# Patient Record
Sex: Female | Born: 1940 | Race: White | Hispanic: No | Marital: Married
Health system: Midwestern US, Academic
[De-identification: ages and names within clinical notes are randomized; demographics above are authoritative.]

---

## 2016-12-19 MED ORDER — CLONAZEPAM 0.25 MG PO TBDI
ORAL_TABLET | 1 refills | Status: DC
Start: 2016-12-19 — End: 2017-02-15

## 2017-02-15 ENCOUNTER — Encounter: Admit: 2017-02-15 | Discharge: 2017-02-15 | Payer: MEDICARE

## 2017-02-15 MED ORDER — CLONAZEPAM 0.25 MG PO TBDI
ORAL_TABLET | 1 refills | Status: AC
Start: 2017-02-15 — End: 2017-05-03

## 2017-02-15 NOTE — Telephone Encounter
Fax received from pt's pharmacy requesting a refill for Clonazepam.  Per protocol refill sent to pt's pharmacy.

## 2017-03-14 ENCOUNTER — Encounter: Admit: 2017-03-14 | Discharge: 2017-03-14 | Payer: MEDICARE

## 2017-03-14 DIAGNOSIS — Z86000 Personal history of in-situ neoplasm of breast: ICD-10-CM

## 2017-03-14 DIAGNOSIS — Z1371 Encounter for nonprocreative screening for genetic disease carrier status: Principal | ICD-10-CM

## 2017-03-14 DIAGNOSIS — D0510 Intraductal carcinoma in situ of unspecified breast: ICD-10-CM

## 2017-03-14 DIAGNOSIS — K219 Gastro-esophageal reflux disease without esophagitis: ICD-10-CM

## 2017-03-14 DIAGNOSIS — G909 Disorder of the autonomic nervous system, unspecified: ICD-10-CM

## 2017-03-14 DIAGNOSIS — G479 Sleep disorder, unspecified: ICD-10-CM

## 2017-03-14 DIAGNOSIS — J45909 Unspecified asthma, uncomplicated: ICD-10-CM

## 2017-03-14 DIAGNOSIS — IMO0002 Genetic counseling: ICD-10-CM

## 2017-03-14 DIAGNOSIS — Z08 Encounter for follow-up examination after completed treatment for malignant neoplasm: Principal | ICD-10-CM

## 2017-03-14 DIAGNOSIS — H269 Unspecified cataract: ICD-10-CM

## 2017-03-14 DIAGNOSIS — E7800 Pure hypercholesterolemia, unspecified: ICD-10-CM

## 2017-03-14 DIAGNOSIS — D0511 Intraductal carcinoma in situ of right breast: Principal | ICD-10-CM

## 2017-03-14 DIAGNOSIS — Z9013 Acquired absence of bilateral breasts and nipples: ICD-10-CM

## 2017-03-14 DIAGNOSIS — I8290 Acute embolism and thrombosis of unspecified vein: ICD-10-CM

## 2017-03-14 DIAGNOSIS — K8020 Calculus of gallbladder without cholecystitis without obstruction: ICD-10-CM

## 2017-03-14 DIAGNOSIS — K3184 Gastroparesis: ICD-10-CM

## 2017-03-14 DIAGNOSIS — R259 Unspecified abnormal involuntary movements: ICD-10-CM

## 2017-03-14 DIAGNOSIS — E079 Disorder of thyroid, unspecified: ICD-10-CM

## 2017-03-14 LAB — CBC AND DIFF
Lab: 0 10*3/uL (ref 0–0.20)
Lab: 4.6 10*3/uL (ref 4.5–11.0)
Lab: 4.7 M/UL (ref 4.0–5.0)

## 2017-03-14 NOTE — Progress Notes
Name: Tara Phillips          MRN: [id number]      DOB: September 17, 1940      AGE: 76 y.o.   DATE OF SERVICE: 03/14/2017    Subjective:             Reason for Visit:  Heme/Onc Care      Tara Phillips is a 76 y.o. female.     Cancer Staging  No matching staging information was found for the patient.    History of Present Illness    Patient is transfer of care from Dr. Grayce Sessions.  EMR and paper chart is reviewed with today's visit.    Tara Phillips is a 76 year old Caucasian female.  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  Status post bilateral mastectomies.  She had a reconstruction.  She did not require hormonal therapy.    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  She had pain over her left breast implant.  She feels that her body is reacting to the implant by way of the lipomas.  She finally had both implants removed in January 2018 and is doing well.    Past Medical History:   Diagnosis Date   ??? Abnormal involuntary movement    ??? Asthma    ??? Blood clot in vein    ??? BRCA negative    ??? Cataract    ??? DCIS (ductal carcinoma in situ)     right breast   ??? Esophageal reflux    ??? Gallstones    ??? Gastroparesis 09/2008   ??? Genetic counseling    ??? Pure hypercholesterolemia    ??? Sleep disorder    ??? Thyroid disease    ??? Unspecified disorder of autonomic nervous system      Past Surgical History:   Procedure Laterality Date   ??? HX HYSTERECTOMY  1984   ??? HX CHOLECYSTECTOMY  2002   ??? UPPER GASTROINTESTINAL ENDOSCOPY  12/2006   ??? COLONOSCOPY  03/2007   ??? LYMPH NODE BIOPSY  06/08/2009    right and left axillary sentinel   ??? BREAST BIOPSY      right - 1983, left - 2001   ??? HX MASTECTOMY      bilateral and reconstruction   ??? OOPHORECTOMY     ??? WRIST SURGERY       Family History   Problem Relation Age of Onset   ??? Cancer Mother      myeloma & kidney cancer   ??? Arthritis-osteo Mother    ??? Cancer Sister 10     lymphoma   ??? Cancer-Thyroid Sister ??? Thyroid Disease Sister    ??? Cancer-Breast Maternal Aunt    ??? Cancer Maternal Aunt    ??? Cancer-Colon Maternal Uncle    ??? Cancer-Breast Maternal Grandmother    ??? Cancer Maternal Grandmother    ??? Cancer-Thyroid Sister 64   ??? Heart Disease Maternal Grandfather    ??? Cancer-Breast Maternal Aunt    ??? Cancer Maternal Aunt    ??? Cancer-Breast Maternal Aunt    ??? Heart Disease Maternal Uncle    ??? Cancer-Breast Other    ??? Cancer-Colon Father    ??? Cancer Father    ??? Arthritis-osteo Father      Social History     Social History   ??? Marital status: Married     Spouse name: N/A   ???  Number of children: N/A   ??? Years of education: N/A     Social History Main Topics   ??? Smoking status: Never Smoker   ??? Smokeless tobacco: Never Used   ??? Alcohol use No   ??? Drug use: No   ??? Sexual activity: Not on file     Other Topics Concern   ??? Not on file     Social History Narrative   ??? No narrative on file            Review of Systems   Gastrointestinal: Positive for constipation.   Psychiatric/Behavioral: Positive for sleep disturbance. The patient is nervous/anxious.    All other systems reviewed and are negative.        Objective:         ??? APIXABAN (ELIQUIS PO) Take 2.5 mg by mouth twice daily.   ??? Biotin 10,000 mcg cap Take  by mouth daily.   ??? bisacodyl (LAXATIVE (BISACODYL)) 10 mg rectal suppository Insert or Apply 10 mg to rectal area as directed daily.   ??? calcium carbonate/vitamin D-3 (OSCAL-500+D) 1250 mg/200 unit tablet Take 1 Tab by mouth twice daily with meals. Calcium Carb 1250mg  delivers 500mg  elemental Ca   ??? CALCIUM-VITAMIN D3 PO Take 500 mg by mouth daily.   ??? clonazePAM(+) (KLONOPIN) 0.25 mg rapid dissolve tablet 1-2 po q HS   ??? MAGNESIUM CITRATE PO Take 150 mg by mouth twice daily.   ??? melatonin 3 mg tab Take 13 mg by mouth at bedtime daily.     Vitals:    03/14/17 1439   BP: 101/53   Pulse: 61   Resp: 16   Temp: 36.6 ???C (97.9 ???F)   TempSrc: Oral   SpO2: 100%   Weight: 60.4 kg (133 lb 3.2 oz)   Height: 168.9 cm (66.5) Body mass index is 21.18 kg/m???.     Pain Score: Zero         Pain Addressed:  N/A    Patient Evaluated for a Clinical Trial: Patient not eligible for a treatment trial (including not needing treatment, needs palliative care, in remission).     [place] Cooperative Oncology Group performance status is 0, Fully active, able to carry on all pre-disease performance without restriction.Marland Kitchen     Physical Exam   Constitutional: She appears well-nourished. No distress.   HENT:   Mouth/Throat: Oropharynx is clear and moist.   Eyes: Conjunctivae are normal. No scleral icterus.   Neck: Neck supple.   Cardiovascular: Normal rate, regular rhythm and normal heart sounds.    Pulmonary/Chest: Effort normal and breath sounds normal. She exhibits no mass (bilateral mastectomy scars, no implant).   Abdominal: Soft. Bowel sounds are normal.   Musculoskeletal: She exhibits no edema or tenderness.   Lymphadenopathy:     She has no cervical adenopathy.     She has no axillary adenopathy.   Neurological: No cranial nerve deficit. Coordination normal.   Skin: Skin is warm.   Psychiatric: She has a normal mood and affect. Her behavior is normal. Thought content normal.     CBC w/Diff    Lab Results   Component Value Date/Time    WBC 4.6 03/14/2017 01:39 PM    RBC 4.77 03/14/2017 01:39 PM    HGB 12.9 03/14/2017 01:39 PM    HCT 39.0 03/14/2017 01:39 PM    MCV 81.8 03/14/2017 01:39 PM    MCH 27.0 03/14/2017 01:39 PM    MCHC 33.0 03/14/2017 01:39 PM  RDW 14.8 03/14/2017 01:39 PM    PLTCT 234 03/14/2017 01:39 PM    MPV 8.2 03/14/2017 01:39 PM    Lab Results   Component Value Date/Time    NEUT 47 03/14/2017 01:39 PM    ANC 2.20 03/14/2017 01:39 PM    LYMA 36 03/14/2017 01:39 PM    ALC 1.70 03/14/2017 01:39 PM    MONA 12 03/14/2017 01:39 PM    AMC 0.60 03/14/2017 01:39 PM    EOSA 4 03/14/2017 01:39 PM    AEC 0.20 03/14/2017 01:39 PM    BASA 1 03/14/2017 01:39 PM    ABC 0.00 03/14/2017 01:39 PM                  Assessment and Plan: 1.  Tara Phillips is 76 years old, who had a history of right breast ductal carcinoma in situ, ER/PR negative.  2010.  Status post bilateral mastectomies.  She has removed her implants.  She said that the lipomas has regressed ever since removing the implants.  There is no clinical evidence of recurrence.  We will continue seeing her on a yearly basis in the oncology clinic at least up to 10 years and then we can offer for her to return surveillance with her primary care physician.    Discussed with the patient and all questions fully answered. She will call me if any problems arise.  25 minutes spent face to face with patient, with more than 50% of time on counseling and coordination of care.

## 2017-03-15 ENCOUNTER — Encounter: Admit: 2017-03-15 | Discharge: 2017-03-15 | Payer: MEDICARE

## 2017-03-15 LAB — COMPREHENSIVE METABOLIC PANEL
Lab: 0.6 mg/dL (ref 0.3–1.2)
Lab: 0.7 mg/dL (ref 0.4–1.00)
Lab: 137 MMOL/L (ref 137–147)
Lab: 14 U/L (ref 7–56)
Lab: 14 mg/dL (ref 7–25)
Lab: 26 MMOL/L (ref 21–30)
Lab: 4.1 g/dL (ref 3.5–5.0)
Lab: 4.6 MMOL/L (ref 3.5–5.1)
Lab: 55 U/L (ref 25–110)
Lab: 6.9 g/dL (ref 6.0–8.0)
Lab: 7 K/UL (ref 3–12)
Lab: 84 mg/dL (ref 70–100)
Lab: 9.3 mg/dL (ref 8.5–10.6)
Lab: >60 mL/min (ref >60)
Lab: >60 mL/min (ref >60)

## 2017-03-15 LAB — LDH-LACTATE DEHYDROGENASE: Lab: 336 U/L — ABNORMAL HIGH (ref 100–210)

## 2017-03-15 NOTE — Telephone Encounter
-----   Message from Sammuel BailiffElizabeth Ng, MD sent at 03/15/2017  8:09 AM CDT -----  I have reviewed all lab results, which are normal or stable. Please inform the patient.

## 2017-03-15 NOTE — Telephone Encounter
Informed the patient that Dr. Olena LeatherwoodNg reviewed all of her lab results and the results are normal or stable. The patient had no questions.

## 2017-05-03 ENCOUNTER — Encounter: Admit: 2017-05-03 | Discharge: 2017-05-03 | Payer: MEDICARE

## 2017-05-03 MED ORDER — CLONAZEPAM 0.25 MG PO TBDI
ORAL_TABLET | 1 refills | Status: AC
Start: 2017-05-03 — End: 2017-10-30

## 2017-05-03 NOTE — Telephone Encounter
Refill request from pt's pharmacy for Clonazepam 0.25 mg. Per protocol refill approved and called into pt's pharmacy.

## 2017-06-21 ENCOUNTER — Ambulatory Visit: Admit: 2017-06-21 | Discharge: 2017-06-21 | Payer: MEDICARE

## 2017-06-21 ENCOUNTER — Encounter: Admit: 2017-06-21 | Discharge: 2017-06-21 | Payer: MEDICARE

## 2017-06-21 DIAGNOSIS — H269 Unspecified cataract: ICD-10-CM

## 2017-06-21 DIAGNOSIS — IMO0002 Genetic counseling: ICD-10-CM

## 2017-06-21 DIAGNOSIS — E7800 Pure hypercholesterolemia, unspecified: ICD-10-CM

## 2017-06-21 DIAGNOSIS — K8020 Calculus of gallbladder without cholecystitis without obstruction: ICD-10-CM

## 2017-06-21 DIAGNOSIS — K3184 Gastroparesis: ICD-10-CM

## 2017-06-21 DIAGNOSIS — I8290 Acute embolism and thrombosis of unspecified vein: ICD-10-CM

## 2017-06-21 DIAGNOSIS — K219 Gastro-esophageal reflux disease without esophagitis: ICD-10-CM

## 2017-06-21 DIAGNOSIS — G479 Sleep disorder, unspecified: ICD-10-CM

## 2017-06-21 DIAGNOSIS — G4752 REM sleep behavior disorder: Principal | ICD-10-CM

## 2017-06-21 DIAGNOSIS — Z1371 Encounter for nonprocreative screening for genetic disease carrier status: Principal | ICD-10-CM

## 2017-06-21 DIAGNOSIS — J45909 Unspecified asthma, uncomplicated: ICD-10-CM

## 2017-06-21 DIAGNOSIS — D0510 Intraductal carcinoma in situ of unspecified breast: ICD-10-CM

## 2017-06-21 DIAGNOSIS — E079 Disorder of thyroid, unspecified: ICD-10-CM

## 2017-06-21 DIAGNOSIS — R259 Unspecified abnormal involuntary movements: ICD-10-CM

## 2017-06-21 DIAGNOSIS — G909 Disorder of the autonomic nervous system, unspecified: ICD-10-CM

## 2017-06-21 MED ORDER — CLONAZEPAM 0.125 MG PO TBDI
0.125 mg | ORAL_TABLET | Freq: Every evening | ORAL | 1 refills | Status: AC
Start: 2017-06-21 — End: 2017-10-30

## 2017-10-30 ENCOUNTER — Ambulatory Visit: Admit: 2017-10-30 | Discharge: 2017-10-30 | Payer: MEDICARE

## 2017-10-30 ENCOUNTER — Encounter: Admit: 2017-10-30 | Discharge: 2017-10-30 | Payer: MEDICARE

## 2017-10-30 DIAGNOSIS — K219 Gastro-esophageal reflux disease without esophagitis: ICD-10-CM

## 2017-10-30 DIAGNOSIS — R259 Unspecified abnormal involuntary movements: ICD-10-CM

## 2017-10-30 DIAGNOSIS — G909 Disorder of the autonomic nervous system, unspecified: ICD-10-CM

## 2017-10-30 DIAGNOSIS — K8020 Calculus of gallbladder without cholecystitis without obstruction: ICD-10-CM

## 2017-10-30 DIAGNOSIS — G4752 REM sleep behavior disorder: Principal | ICD-10-CM

## 2017-10-30 DIAGNOSIS — E079 Disorder of thyroid, unspecified: ICD-10-CM

## 2017-10-30 DIAGNOSIS — G479 Sleep disorder, unspecified: ICD-10-CM

## 2017-10-30 DIAGNOSIS — H269 Unspecified cataract: ICD-10-CM

## 2017-10-30 DIAGNOSIS — Z1371 Encounter for nonprocreative screening for genetic disease carrier status: Principal | ICD-10-CM

## 2017-10-30 DIAGNOSIS — I8290 Acute embolism and thrombosis of unspecified vein: ICD-10-CM

## 2017-10-30 DIAGNOSIS — IMO0002 Genetic counseling: ICD-10-CM

## 2017-10-30 DIAGNOSIS — E7800 Pure hypercholesterolemia, unspecified: ICD-10-CM

## 2017-10-30 DIAGNOSIS — D0510 Intraductal carcinoma in situ of unspecified breast: ICD-10-CM

## 2017-10-30 DIAGNOSIS — J45909 Unspecified asthma, uncomplicated: ICD-10-CM

## 2017-10-30 DIAGNOSIS — K3184 Gastroparesis: ICD-10-CM

## 2018-03-14 ENCOUNTER — Encounter: Admit: 2018-03-14 | Discharge: 2018-03-14 | Payer: MEDICARE

## 2019-09-27 ENCOUNTER — Encounter: Admit: 2019-09-27 | Discharge: 2019-09-27 | Payer: MEDICARE

## 2019-09-27 ENCOUNTER — Ambulatory Visit: Admit: 2019-09-27 | Discharge: 2019-09-28 | Payer: MEDICARE

## 2019-09-27 MED ORDER — COVID-19 VACC,MRNA(PFIZER)(PF) 30 MCG/0.3 ML IM SUSR
30 ug | Freq: Once | INTRAMUSCULAR | 0 refills | Status: CP
Start: 2019-09-27 — End: ?

## 2019-10-21 ENCOUNTER — Ambulatory Visit: Admit: 2019-10-21 | Discharge: 2019-10-22 | Payer: MEDICARE

## 2019-10-21 ENCOUNTER — Encounter: Admit: 2019-10-21 | Discharge: 2019-10-21 | Payer: MEDICARE

## 2019-10-21 MED ORDER — COVID-19 VACC,MRNA(PFIZER)(PF) 30 MCG/0.3 ML IM SUSR
30 ug | Freq: Once | INTRAMUSCULAR | 0 refills | Status: CP
Start: 2019-10-21 — End: ?

## 2019-10-22 ENCOUNTER — Encounter: Admit: 2019-10-22 | Discharge: 2019-10-22 | Payer: MEDICARE

## 2022-02-08 IMAGING — MR MRI LUMBAR SPINE W/WO CONTRAST
4 of 11 series · 9 of 48 positions shown · IV contrast (Gadolinium)
Comparison: None

________________________________________________________________________________________________ 
MRI LUMBAR SPINE W/WO CONTRAST, 02/08/2022 [DATE]: 
CLINICAL INDICATION: Right thigh pain
TECHNIQUE: Sagittal T1, Sagittal T2, Sagittal STIR, Axial T2, Axial T1, Enhanced 
sagittal T1, Enhanced fat suppressed sagittal T1, and Enhanced axial T1 MR 
images of the lumbar spine were performed with and without intravenous 
enhancement.  6.5 mL of Gadavist were injected intravenously. 1.0 mL of Gadavist 
was discarded.  Patient was scanned on a 1.5T magnet.

[Series 101: survey · axial · 10.0mm · 1.25mm/px · z∈[-33,+201]mm · 2 of 10 slices shown]
[im 1/10]
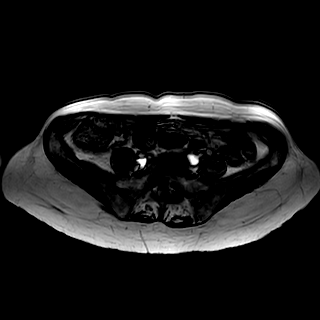
[im 10/10]
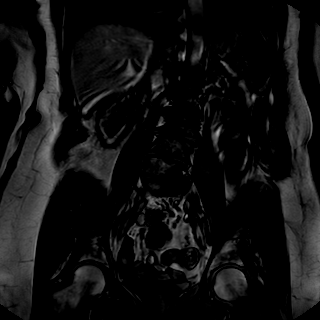

[Series 201: t2w_cor-surv · coronal · 6.0mm · 0.62mm/px · 2 of 14 slices shown]
[im 1/14]
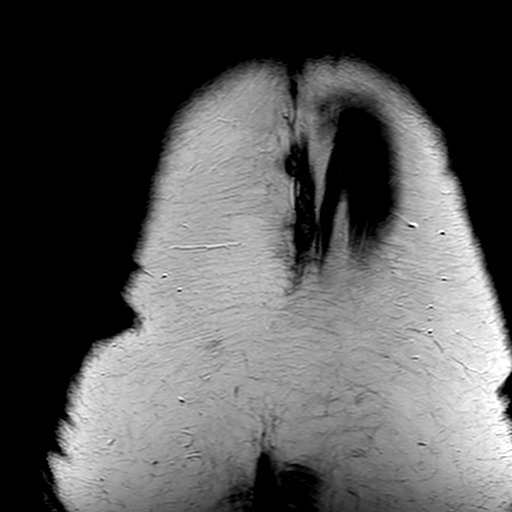
[im 14/14]
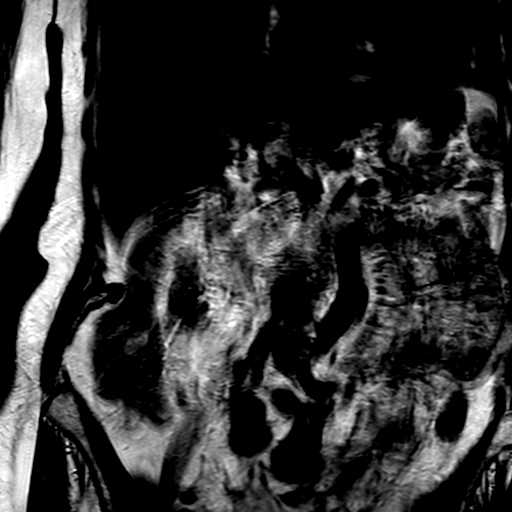

[Series 301: t1_tse_sag · sagittal · 4.0mm · 0.51mm/px · 2 of 17 slices shown]
[im 1/17]
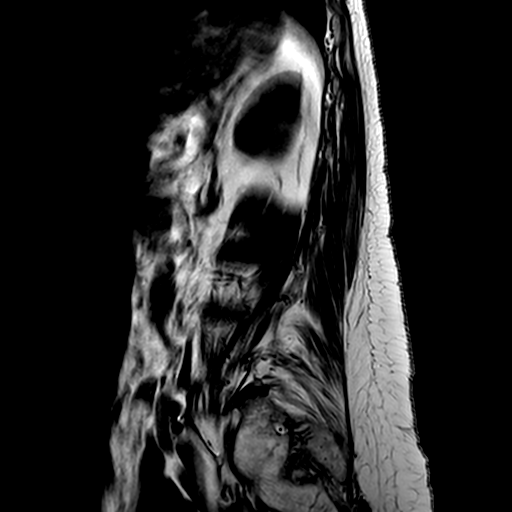
[im 17/17]
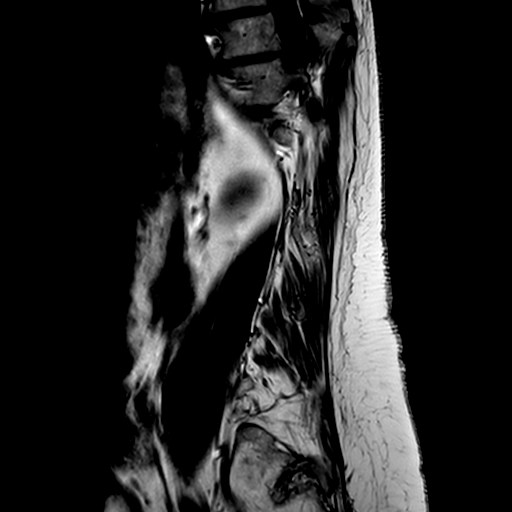

[Series 402: (id) view_ax mpr · axial · 1.0mm · 0.25mm/px · z∈[+15,+187]mm · 3 of 97 slices shown]
[im 18/97]
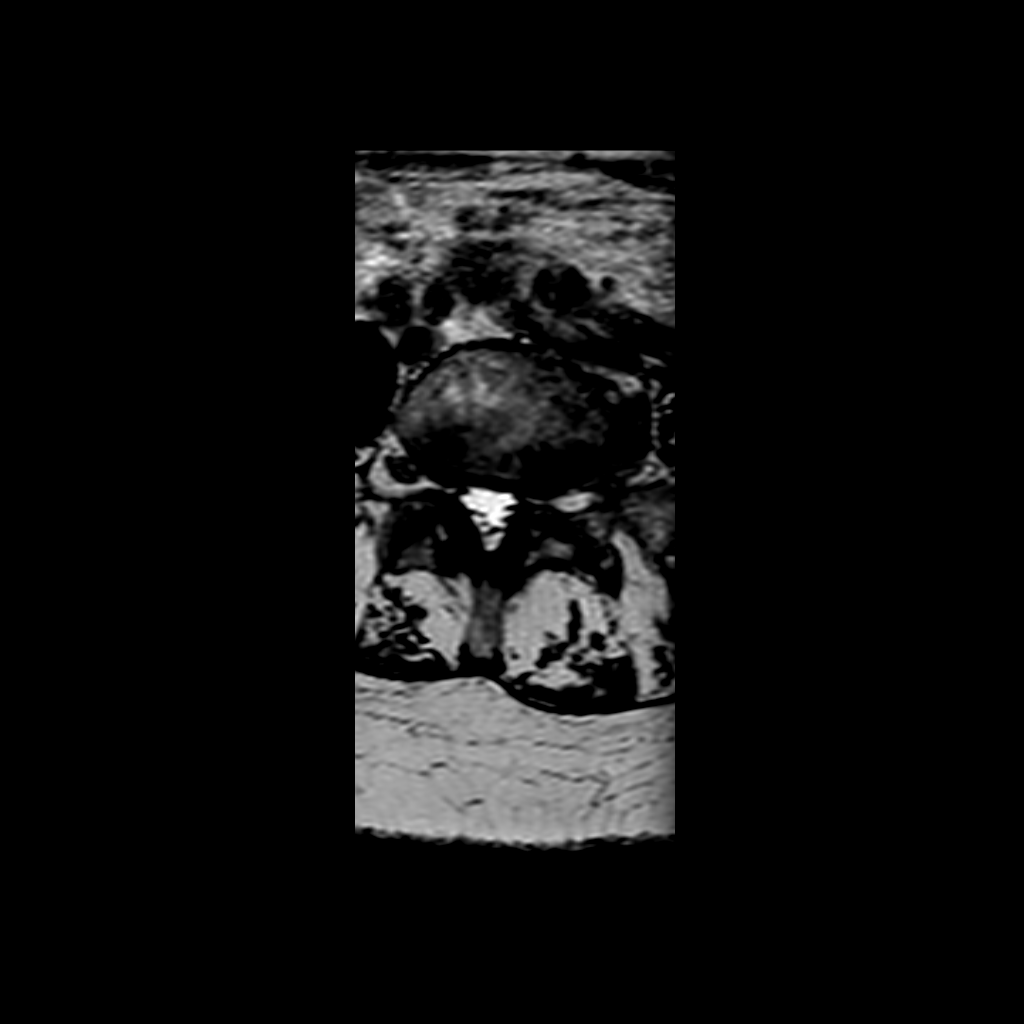
[im 53/97]
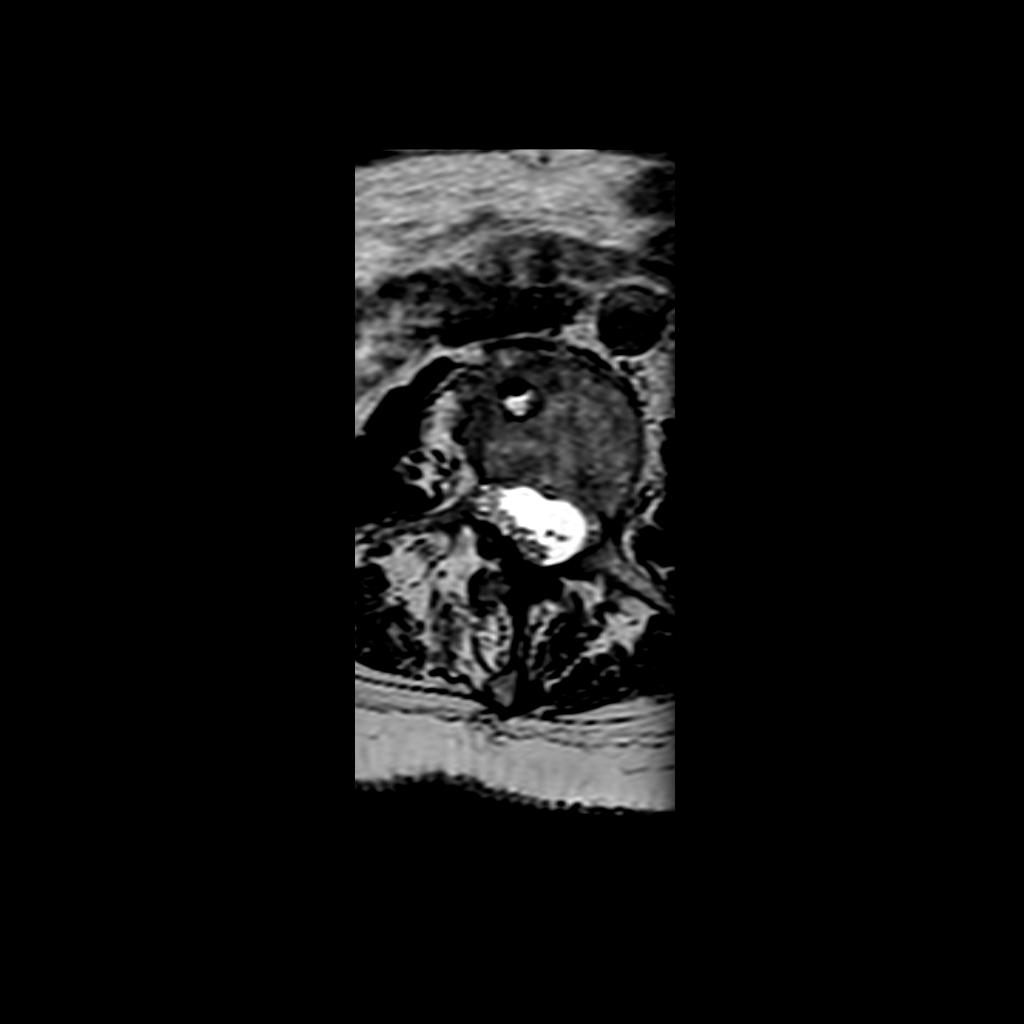
[im 88/97]
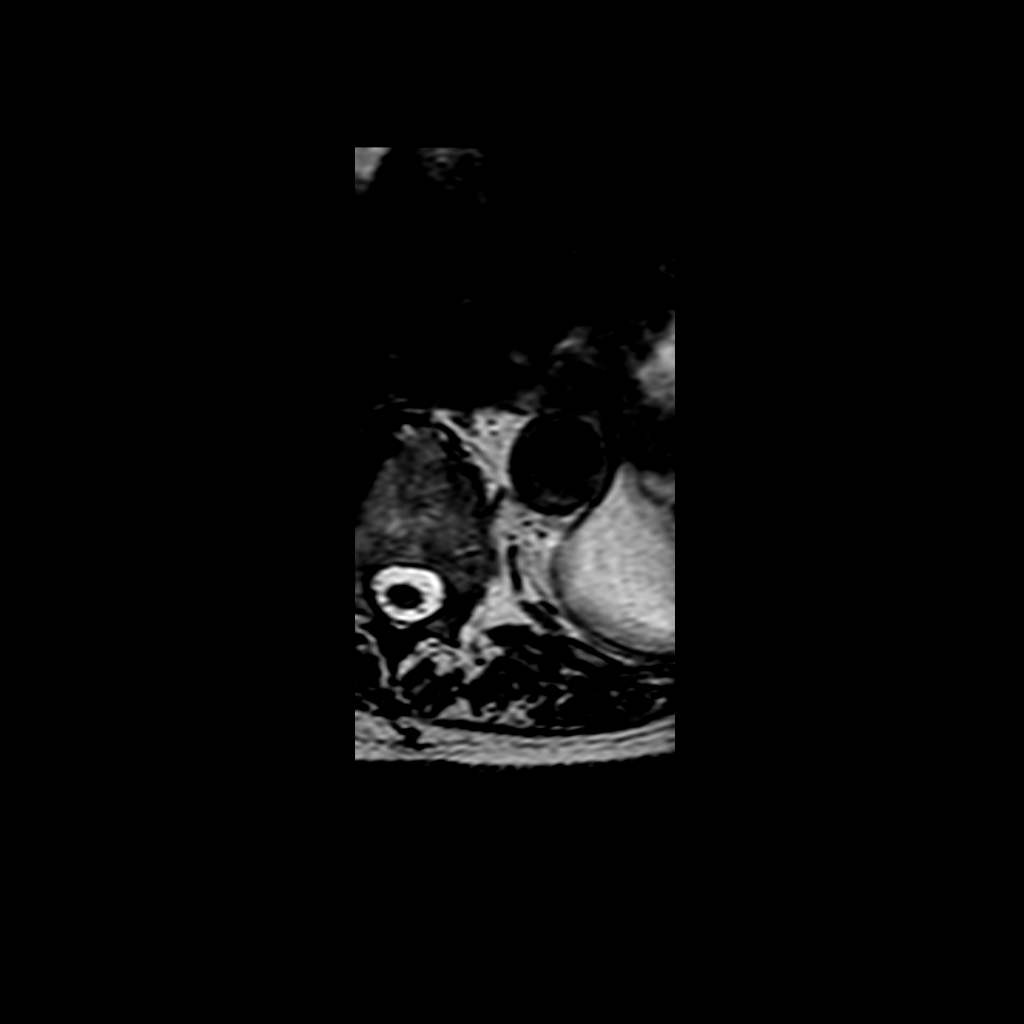

[9 of 48 positions shown; findings below may reference images not displayed]

FINDINGS: There is a chronic wedge compression fracture of L1 with approximately 
30% loss of height. There are Modic type I changes at L5-S1, mild involvement 
along the right L1-2 interspace. There is moderate upper lumbar levoscoliosis. 
There is no evidence for spinal malignancy. A round T1 hypointense focus in the 
inferior L4 3 segment likely is degenerative 
There is mild canal stenosis at L4-5, borderline at L3-4. No other significant 
canal stenosis. There is marked left foraminal stenosis at L5-S1, moderate 
right-sided foraminal narrowing at L1-2 and L3-4. 
Conus terminates opposite L1. 
There are facet degenerative changes throughout the lumbar spine, with marked 
involvement at L5-S1 and L4-5.
IMPRESSION: Multilevel degenerative changes associated with moderately severe upper lumbar 
levoscoliosis. There is mild canal stenosis at L4-5, borderline canal stenosis 
at L3-4. There is marked left foraminal stenosis at L5-S1, moderate right 
foraminal narrowing at L3-4 and L1-2. Changes at L1-2 could be producing right 
thigh symptoms. Clinical correlation. 
Modic type I changes, most pronounced on the left at L5-S1, on the right at 
L1-2. 
No evidence for recent fracture. There is a mild chronic L1 compression 
fracture, with loss of height worse on the right.  
Signal abnormalities in vertebral marrow are present at several levels. These 
appear related to hemangiomas and subchondral endplate cysts. Apparent 
ill-defined enhancement in the upper L1 vertebral segment is likely due to fat 
depleted hemangioma, not showing increased signal on the Ochena Mon Teenpatti 
series. Follow-up lumbar CT recommended to confirm the benign nature of this 
finding.

## 2022-08-14 IMAGING — CT CT CALCIUM SCORING
1 series · 15 of 20 positions shown, 19 images · non-contrast
Comparison: There are no previous exams available for comparison.

________________________________________________________________________________________________ 
CT CALCIUM SCORING, 08/14/2022 [DATE]:
INDICATION: Hyperlipidemia, Unspecified

[Series 2: cascoreseq 3.0 b35s 60% · axial · 0.42mm/px · z∈[-211,-76]mm · 15 of 102 slices shown, 19 images]
[im 6/102  vessel]
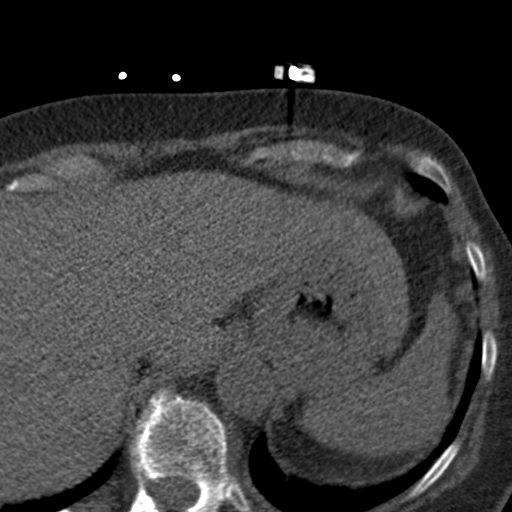
[im 6/102  lung]
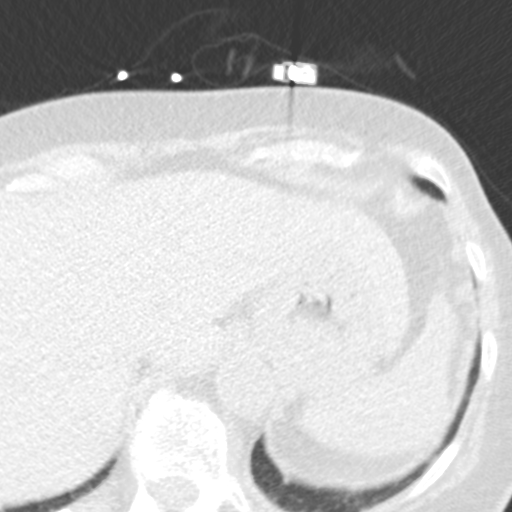
[im 11/102  vessel]
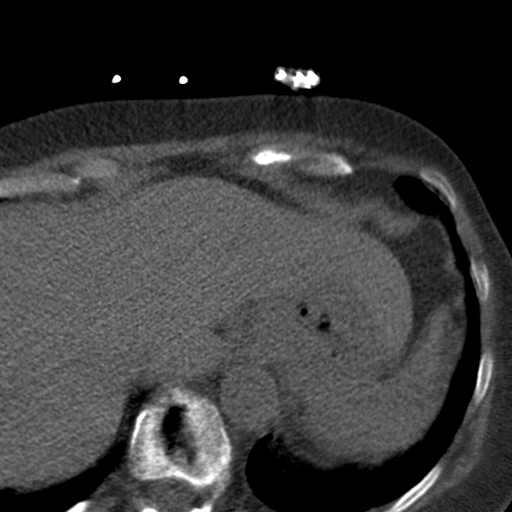
[im 22/102  vessel]
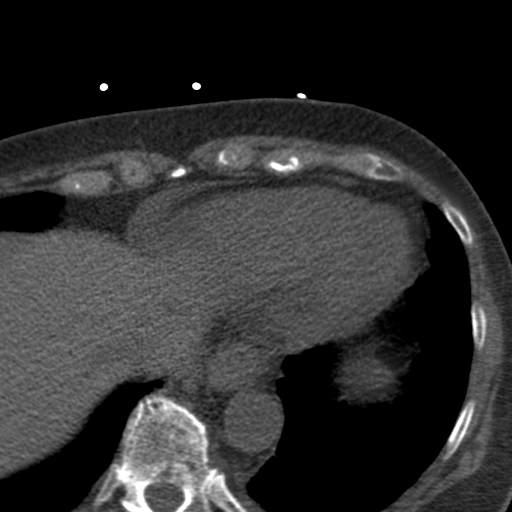
[im 27/102  vessel]
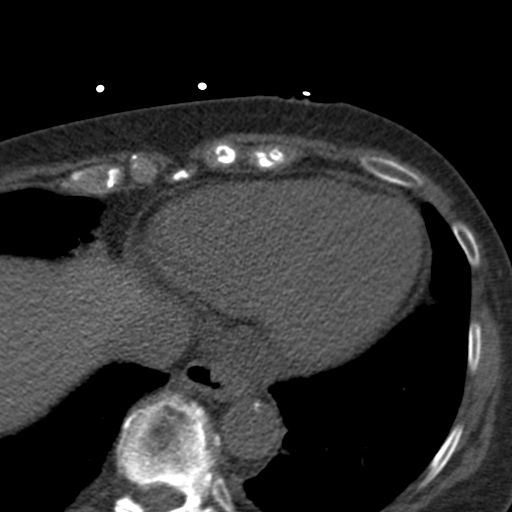
[im 32/102  vessel]
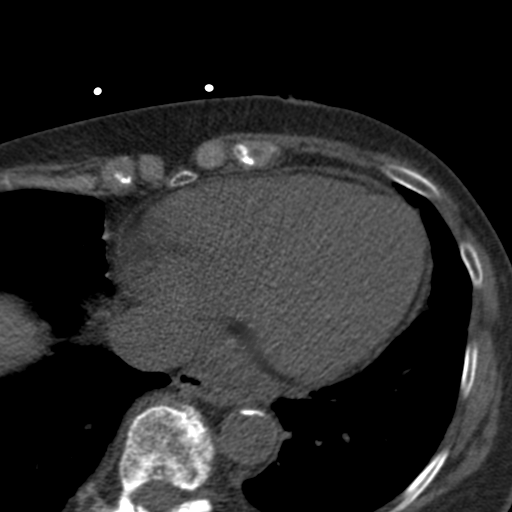
[im 32/102  lung]
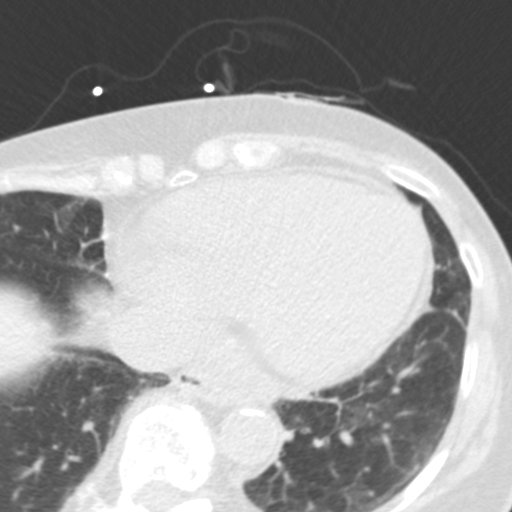
[im 38/102  vessel]
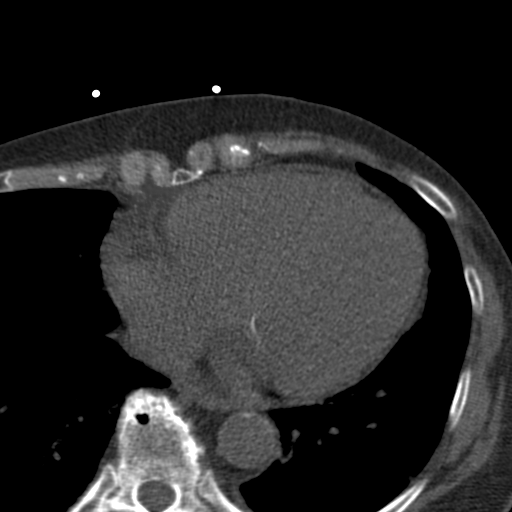
[im 43/102  vessel]
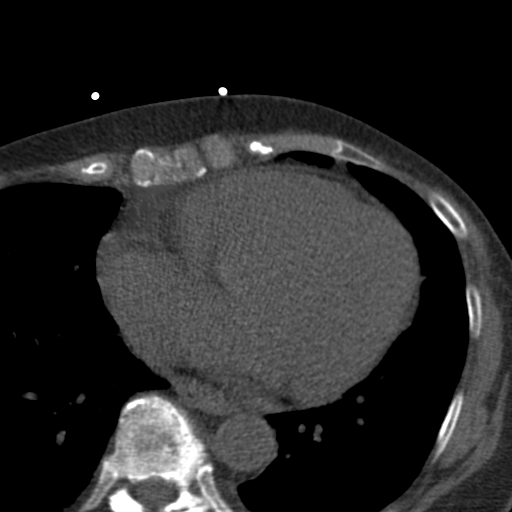
[im 54/102  vessel]
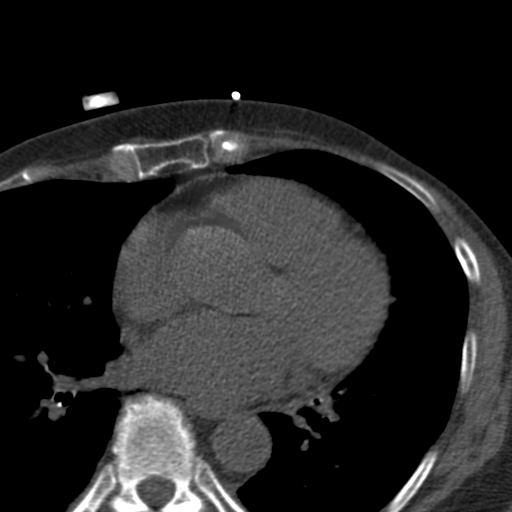
[im 59/102  vessel]
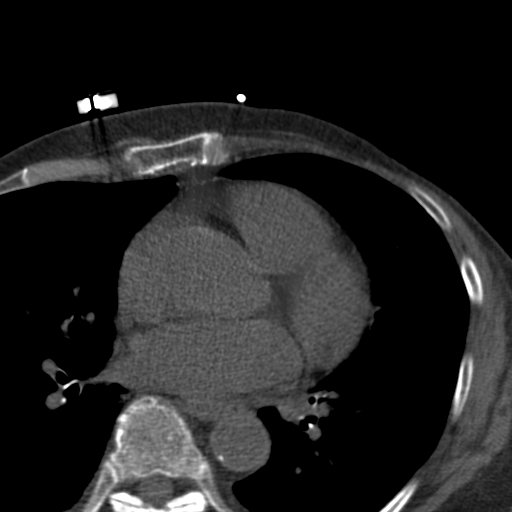
[im 59/102  lung]
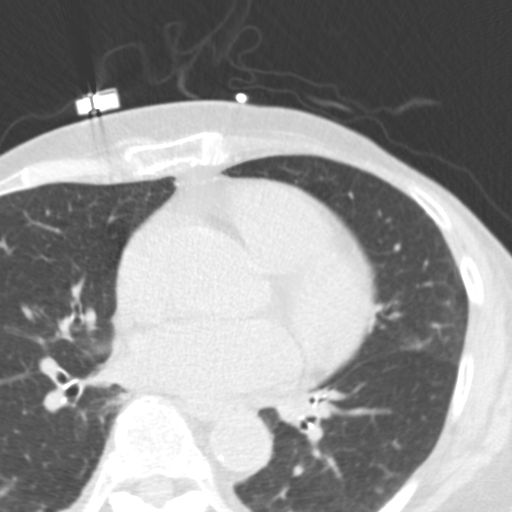
[im 64/102  vessel]
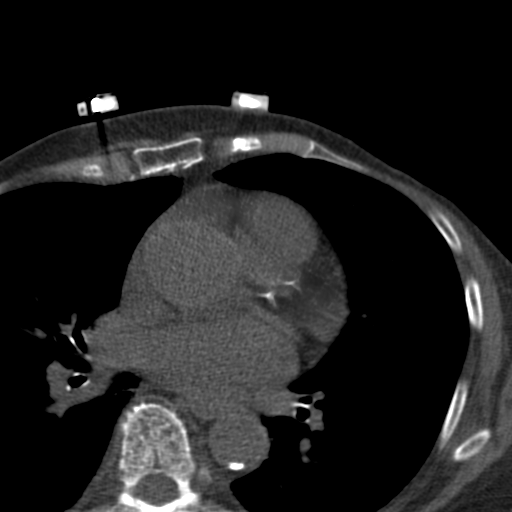
[im 70/102  vessel]
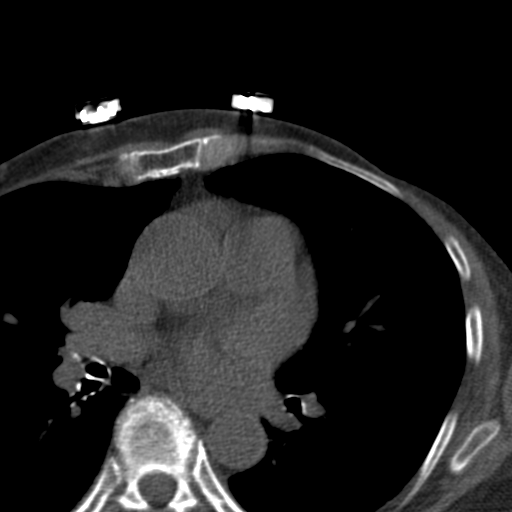
[im 75/102  vessel]
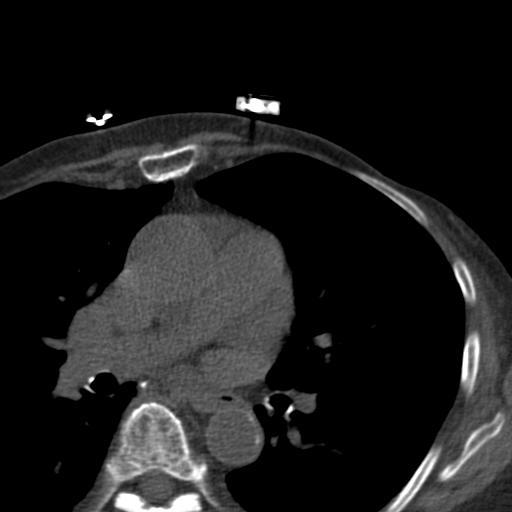
[im 86/102  vessel]
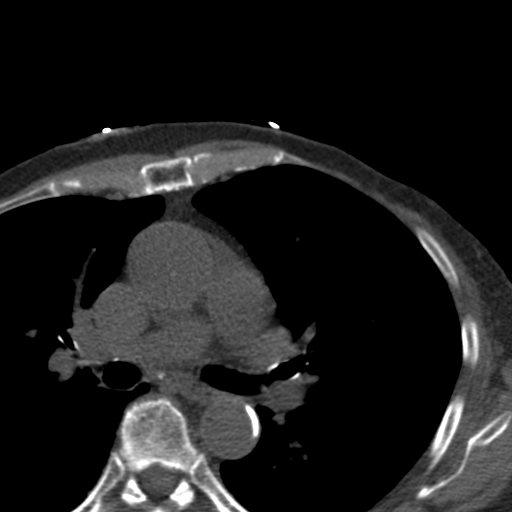
[im 86/102  lung]
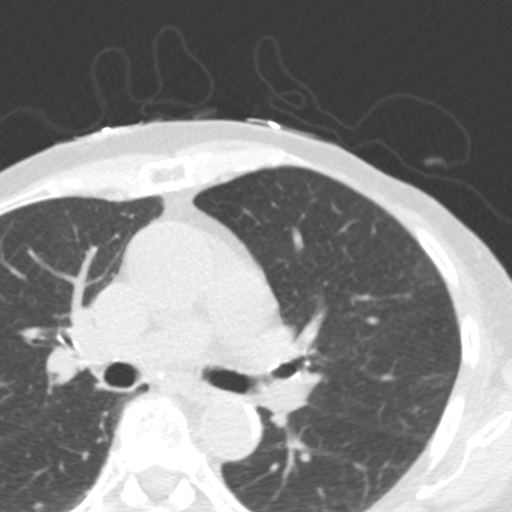
[im 91/102  vessel]
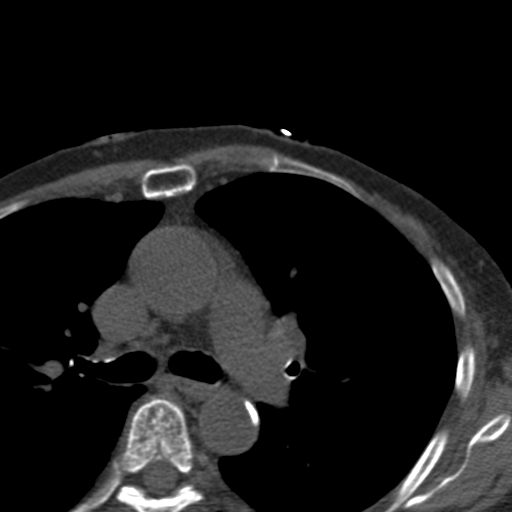
[im 96/102  vessel]
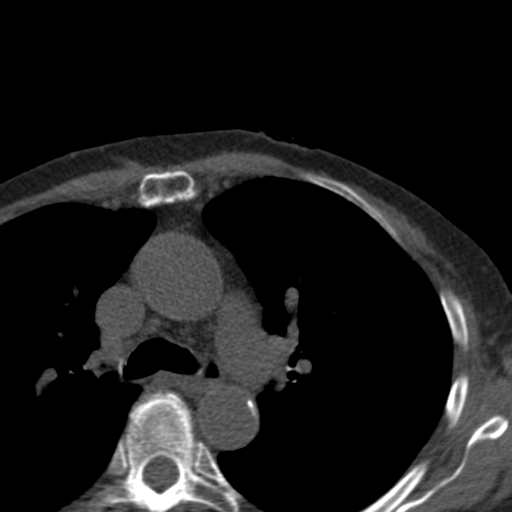

[15 of 20 positions shown; findings below may reference images not displayed]

FINDINGS: Question nodule seen on the scout view only. Recommend dedicated CT the chest.
IMPRESSION: Total Calcium Score: 48 
Nodule seen on scout view only. Recommend dedicated CT the chest. 
Calcium score                                     Presence of Plaque 
0                                                    No evidence of plaque 
1-10                                               Minimal evidence of plaque 
11-100                                            Mild evidence of plaque 
101-400                                          Moderate evidence of plaque 
Over 400                                        Extensive evidence of plaque     

Calcium scoring sheets to follow. 
RADIATION DOSE REDUCTION: All CT scans are performed using radiation dose 
reduction techniques, when applicable.  Technical factors are evaluated and 
adjusted to ensure appropriate moderation of exposure.  Automated dose 
management technology is applied to adjust the radiation doses to minimize 
exposure while achieving diagnostic quality images.

## 2023-10-17 IMAGING — MR MRI LEFT WRIST WITHOUT CONTRAST
4 of 6 series · 16 of 40 positions shown · IV contrast (gadolinium)
Comparison: None.

________________________________________________________________________________________________ 
MRI LEFT WRIST WITHOUT CONTRAST, 10/17/2023 [DATE]: 
CLINICAL INDICATION: Pain In Left Wrist
TECHNIQUE: Multiplanar, multiecho position MR images of the wrist were performed 
without intravenous gadolinium enhancement. Patient was scanned on a
magnet.

[Series 301: T1 · axial · left · 3.0mm · 0.14mm/px · z∈[-56,+18]mm · 3 of 29 slices shown]
[im 4/29]
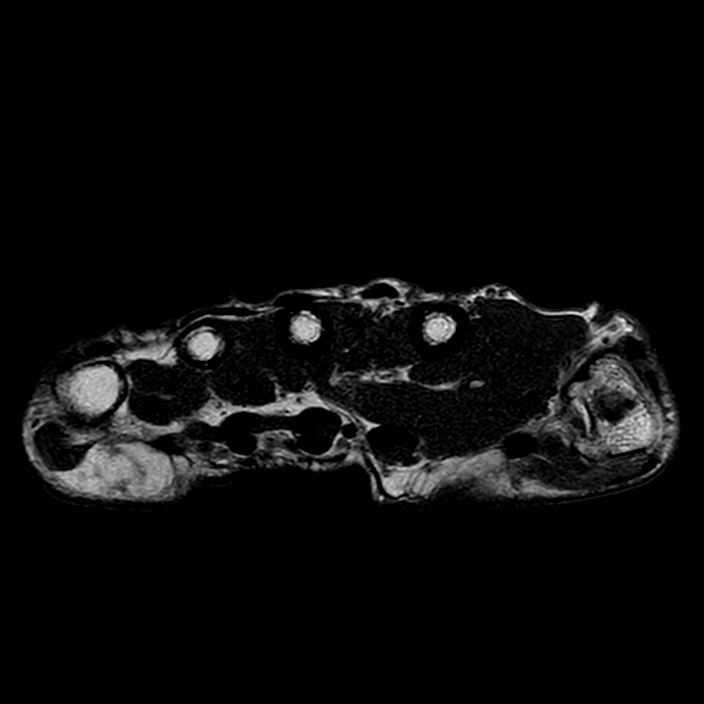
[im 15/29]
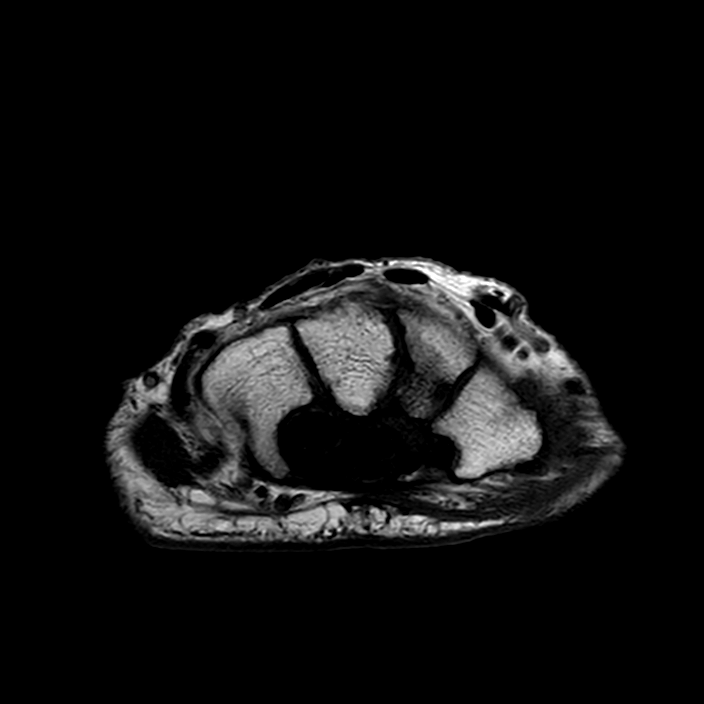
[im 25/29]
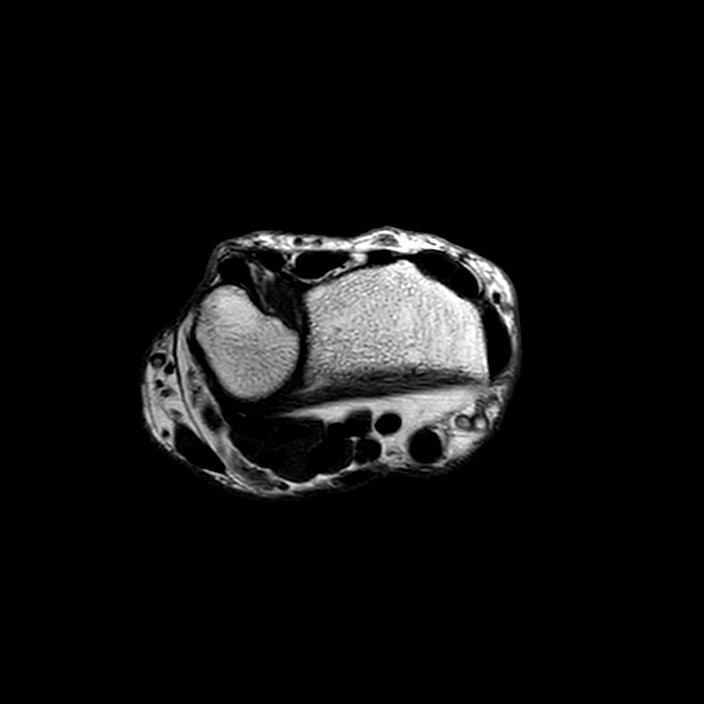

[Series 401: PD fat-sat · axial · left · 3.0mm · 0.19mm/px · z∈[-66,+18]mm · 7 of 29 slices shown (1 of 3)]
[im 1/29]
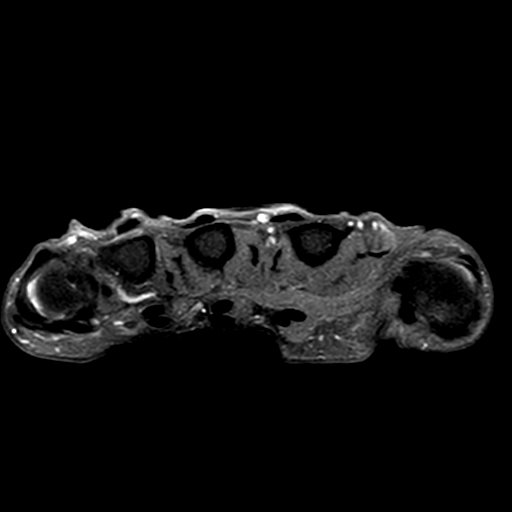
[im 4/29]
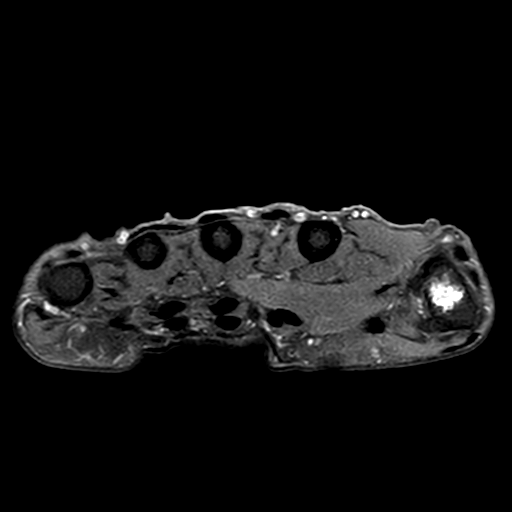
[im 8/29]
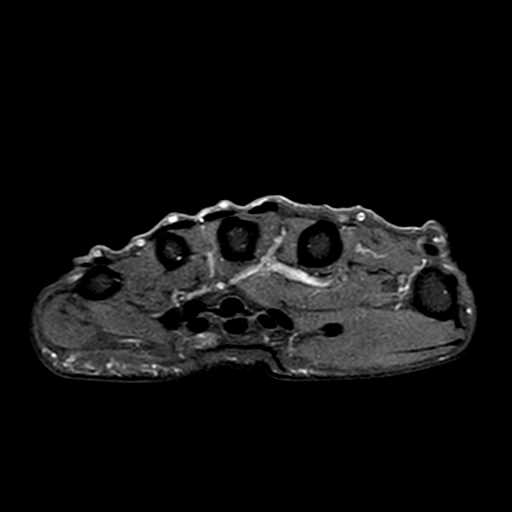
[im 11/29]
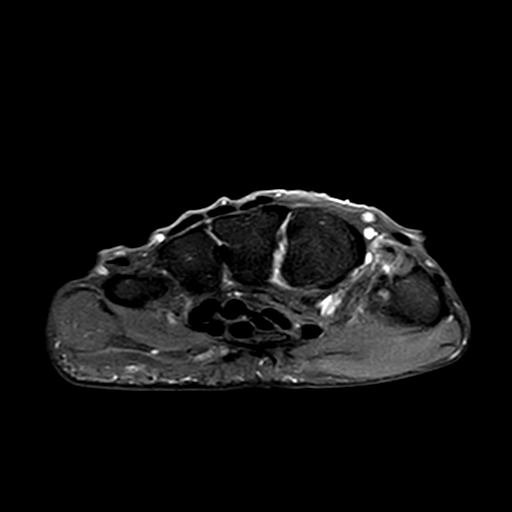
[im 15/29]
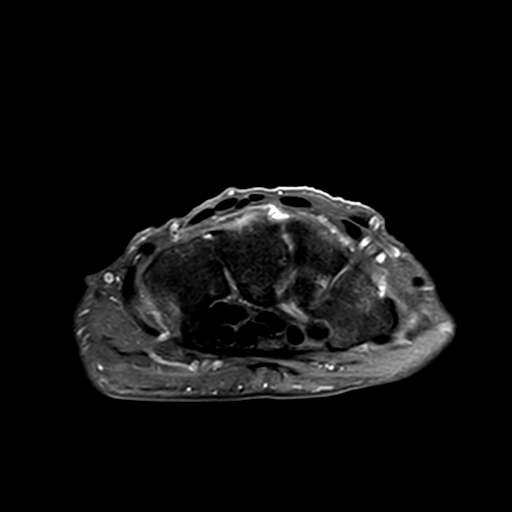
[im 18/29]
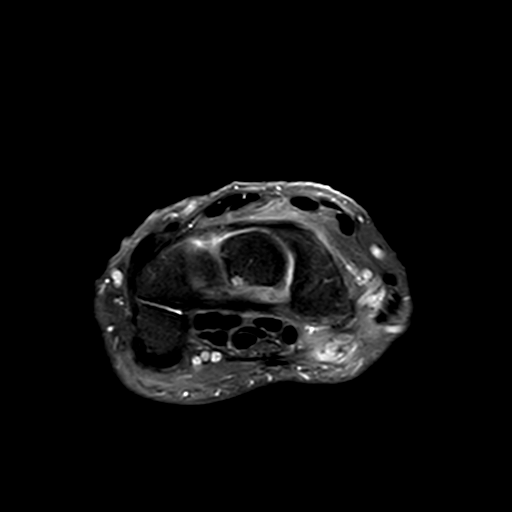
[im 25/29]
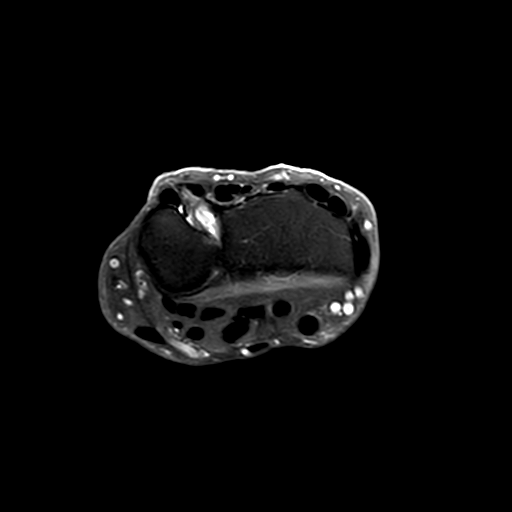

[Series 601: PD fat-sat · coronal · left · 2.5mm · 0.23mm/px · 3 of 18 slices shown (2 of 3)]
[im 4/18]
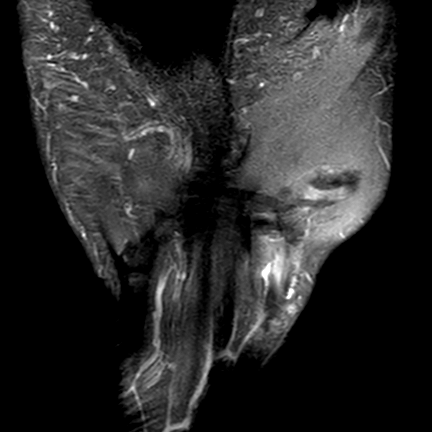
[im 11/18]
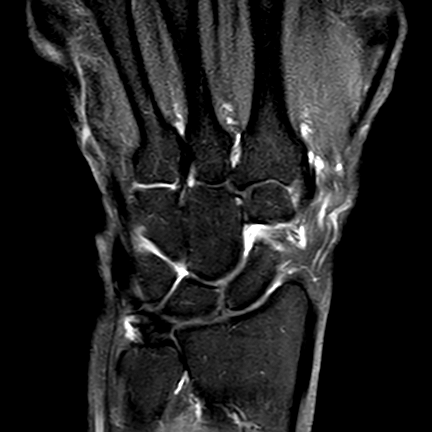
[im 18/18]
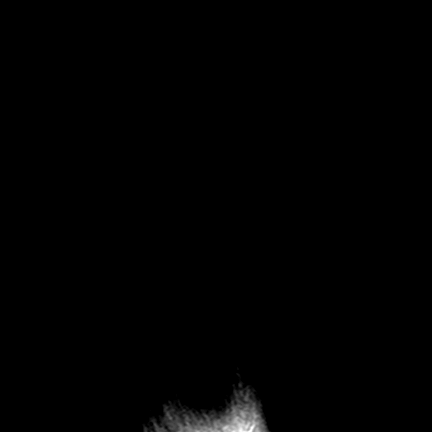

[Series 701: PD fat-sat · sagittal · left · 3.0mm · 0.23mm/px · 3 of 23 slices shown (3 of 3)]
[im 4/23]
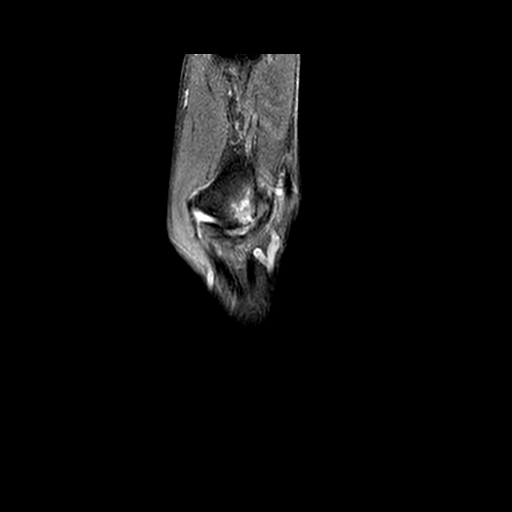
[im 12/23]
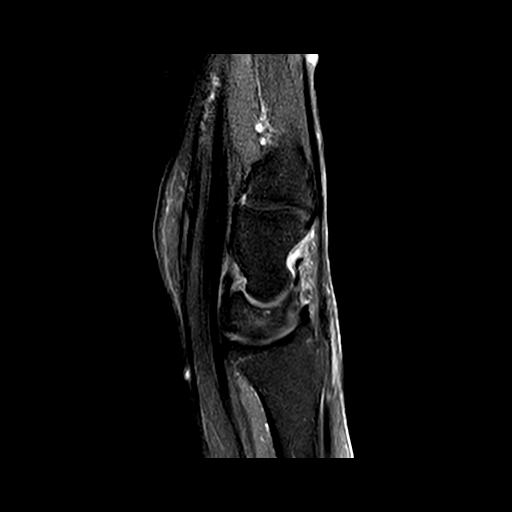
[im 19/23]
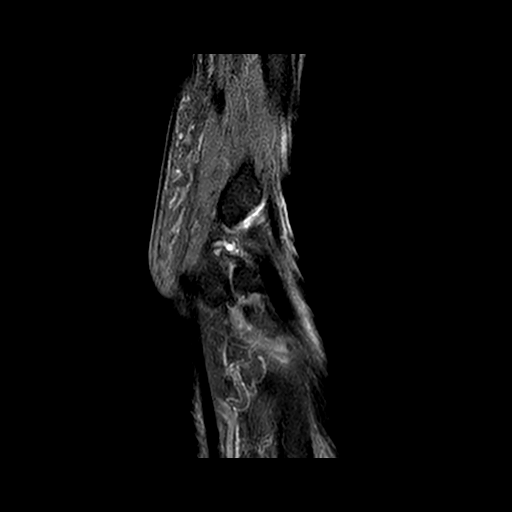

[16 of 40 positions shown; findings below may reference images not displayed]

FINDINGS: BONES AND JOINTS: No fracture or stress response. Marked degenerative change of 
the thumb carpometacarpal (3R3-V) joint and scaphotrapeziotrapezoid (STT) 
complex with full-thickness chondromalacia and subcortical cysts. Moderate 
degenerative change of the thumb metacarpophalangeal (MCP) joints and 
subcortical cysts in the distal thumb metacarpal. Tiny additional carpal cysts. 
Normal alignment. The distal ulna is well located within the radial sigmoid 
notch. Small midcarpal joint effusion. No discrete synovitis. 
LIGAMENTS: Degenerative full-thickness tear of the membranous portion of the 
scapholunate ligament without widening of the interspace. The lunotriquetral 
ligament is preserved. The central disc of the TFCC is preserved. The volar and 
dorsal components of the radioulnar ligament of the TFCC are intact. The 
ulnocarpal components of the TFCC are intact. The extrinsic ligaments are 
grossly intact.  
TENDONS: Full-thickness rupture of the flexor carpi radialis (FCR) tendon at the 
level of the trapezium with up to 1.1 cm distraction of the tendinous remnants 
(sequence 601 image 5, sequence 701 images 8-9, sequence 401 images 17-19). 
Partial thickness tearing of the FCR tendinous margins and thickening (0.5 x
cm in cross-section) of the proximally retracted tendon. Small amount of fluid 
in the FCR tendon sheath. The remaining flexor and extensor tendons are 
preserved. The flexor retinaculum is not bowed. 
SOFT TISSUES: Musculature is symmetric without mass, signal abnormality or 
atrophy. Neurovascular bundles are negative. Specifically, the ulnar nerve is 
negative at the level of Cypriano canal. No focal fluid collection or suggestion of 
ganglion.
IMPRESSION: 1.  Full-thickness rupture of the flexor carpi radialis tendon at the level of 
the trapezium with up to 1.1 cm distraction of the tendinous remnants. 
2.  Multifocal degenerative change, most marked involving the 3R3-V joint and 
STT complex.  
3.  Degenerative full-thickness tear of the membranous portion of the 
scapholunate ligament.  
4.  Small midcarpal joint effusion.
